# Patient Record
Sex: Male | Born: 1940 | Race: White | Hispanic: No | Marital: Married | Smoking: Current every day smoker
Health system: Southern US, Community
[De-identification: ages and names within clinical notes are randomized; demographics above are authoritative.]

## PROBLEM LIST (undated history)

## (undated) DIAGNOSIS — Z9289 Personal history of other medical treatment: Secondary | ICD-10-CM

## (undated) HISTORY — DX: Personal history of other medical treatment: Z92.89

---

## 1998-11-29 ENCOUNTER — Emergency Department (HOSPITAL_COMMUNITY): Admission: EM | Admit: 1998-11-29 | Discharge: 1998-11-29 | Payer: Self-pay | Admitting: Emergency Medicine

## 1999-08-21 ENCOUNTER — Encounter: Admission: RE | Admit: 1999-08-21 | Discharge: 1999-08-21 | Payer: Self-pay | Admitting: Family Medicine

## 1999-09-10 ENCOUNTER — Ambulatory Visit (HOSPITAL_COMMUNITY): Admission: RE | Admit: 1999-09-10 | Discharge: 1999-09-10 | Payer: Self-pay | Admitting: *Deleted

## 2001-11-11 DIAGNOSIS — Z9289 Personal history of other medical treatment: Secondary | ICD-10-CM

## 2001-11-11 HISTORY — DX: Personal history of other medical treatment: Z92.89

## 2002-01-20 ENCOUNTER — Encounter: Payer: Self-pay | Admitting: Family Medicine

## 2002-01-20 ENCOUNTER — Encounter: Admission: RE | Admit: 2002-01-20 | Discharge: 2002-01-20 | Payer: Self-pay | Admitting: Family Medicine

## 2002-09-24 ENCOUNTER — Encounter: Payer: Self-pay | Admitting: Family Medicine

## 2002-09-24 ENCOUNTER — Encounter: Admission: RE | Admit: 2002-09-24 | Discharge: 2002-09-24 | Payer: Self-pay | Admitting: Family Medicine

## 2002-09-27 ENCOUNTER — Encounter: Admission: RE | Admit: 2002-09-27 | Discharge: 2002-09-27 | Payer: Self-pay | Admitting: Family Medicine

## 2002-09-27 ENCOUNTER — Encounter: Payer: Self-pay | Admitting: Family Medicine

## 2002-11-11 DIAGNOSIS — Z9289 Personal history of other medical treatment: Secondary | ICD-10-CM

## 2002-11-11 HISTORY — DX: Personal history of other medical treatment: Z92.89

## 2002-11-19 ENCOUNTER — Ambulatory Visit (HOSPITAL_COMMUNITY): Admission: RE | Admit: 2002-11-19 | Discharge: 2002-11-19 | Payer: Self-pay | Admitting: *Deleted

## 2004-06-25 ENCOUNTER — Encounter: Admission: RE | Admit: 2004-06-25 | Discharge: 2004-07-05 | Payer: Self-pay | Admitting: Family Medicine

## 2004-07-23 ENCOUNTER — Encounter: Admission: RE | Admit: 2004-07-23 | Discharge: 2004-07-23 | Payer: Self-pay | Admitting: Gastroenterology

## 2004-09-28 ENCOUNTER — Ambulatory Visit (HOSPITAL_COMMUNITY): Admission: RE | Admit: 2004-09-28 | Discharge: 2004-09-28 | Payer: Self-pay | Admitting: Gastroenterology

## 2004-10-12 ENCOUNTER — Encounter: Admission: RE | Admit: 2004-10-12 | Discharge: 2004-10-12 | Payer: Self-pay | Admitting: Gastroenterology

## 2004-11-08 ENCOUNTER — Encounter: Admission: RE | Admit: 2004-11-08 | Discharge: 2005-02-06 | Payer: Self-pay | Admitting: Family Medicine

## 2005-05-28 ENCOUNTER — Encounter: Admission: RE | Admit: 2005-05-28 | Discharge: 2005-05-28 | Payer: Self-pay | Admitting: Family Medicine

## 2005-06-05 ENCOUNTER — Encounter: Admission: RE | Admit: 2005-06-05 | Discharge: 2005-06-05 | Payer: Self-pay | Admitting: Family Medicine

## 2005-06-07 ENCOUNTER — Encounter: Admission: RE | Admit: 2005-06-07 | Discharge: 2005-06-07 | Payer: Self-pay | Admitting: Family Medicine

## 2005-06-16 ENCOUNTER — Encounter: Admission: RE | Admit: 2005-06-16 | Discharge: 2005-06-16 | Payer: Self-pay | Admitting: Family Medicine

## 2007-04-15 ENCOUNTER — Encounter: Admission: RE | Admit: 2007-04-15 | Discharge: 2007-04-15 | Payer: Self-pay | Admitting: Family Medicine

## 2008-08-15 ENCOUNTER — Encounter: Admission: RE | Admit: 2008-08-15 | Discharge: 2008-08-15 | Payer: Self-pay | Admitting: Family Medicine

## 2008-12-06 ENCOUNTER — Encounter: Admission: RE | Admit: 2008-12-06 | Discharge: 2008-12-06 | Payer: Self-pay | Admitting: Family Medicine

## 2008-12-22 ENCOUNTER — Ambulatory Visit (HOSPITAL_COMMUNITY): Admission: RE | Admit: 2008-12-22 | Discharge: 2008-12-22 | Payer: Self-pay | Admitting: Urology

## 2009-12-04 ENCOUNTER — Encounter: Admission: RE | Admit: 2009-12-04 | Discharge: 2009-12-04 | Payer: Self-pay | Admitting: Family Medicine

## 2010-11-11 DIAGNOSIS — Z9289 Personal history of other medical treatment: Secondary | ICD-10-CM

## 2010-11-11 HISTORY — DX: Personal history of other medical treatment: Z92.89

## 2011-03-29 NOTE — Op Note (Signed)
   NAME:  Jay Moss, Jay Moss                        ACCOUNT NO.:  [id number]   MEDICAL RECORD NO.:  [id number]                   PATIENT TYPE:  OIB   LOCATION:  2855                                 FACILITY:  [facility]   PHYSICIAN:  Darlin Priestly, M.D.             DATE OF BIRTH:  [birth year]   DATE OF PROCEDURE:  11/19/2002  DATE OF DISCHARGE:                                 OPERATIVE REPORT   PROCEDURE:  Head-up tilt-table testing.   COMPLICATIONS:  None.   INDICATIONS:  The patient is a 70 year old male, patient of Dr. Talmadge Coventry, also seen by Dr. Oleh Genin for recurrent presyncope.  This has  happened both while sitting and standing.  The patient has undergone  extensive neurologic work-up, which was essentially negative. He has also  undergone 2-D echocardiogram revealing a mildly depressed EF of 45-50% with  mild anterior septal hypokinesis.  Yesterday, he had a Cardiolite scan  revealing very minimal inferoapical ischemia with a normal EF.  We did ask  him to undergo a tilt-table test to rule out neurogenic syncope.   DESCRIPTION OF PROCEDURE:  After given informed written consent, the patient  was brought to the cardiac catheterization lab in the fasting state.  The  patient was then placed in the supine position and hemodynamic measurements  were obtained.  Baseline blood pressure was 120/81 with a resting heart rate  of 111.  The patient was monitored for approximately 6 minutes and then was  tilted to a 70degree heads up position. The patient did well for  approximately 19 minutes; however, after 20 minutes, the patient began  complaining of feeling nauseated and hot. His blood pressure dropped to  57/44 with and heart rate dropped from 106 to 79.  The patient was again  returned to supine position.  He continued to complain of symptoms for  approximately 5-6 minutes and then had complete recovery of his blood  pressure and heart rate.  At the termination of the test,  the patient was  asymptomatic.   CONCLUSIONS:  Positive head-up tilt-table testing.                                                   Darlin Priestly, M.D.    RHM/MEDQ  D:  11/19/2002  T:  11/20/2002  Job:  [id number]   cc:   Talmadge Coventry, M.D.  [address], [address]  [place]  [place] [address]  Fax: [phone]   [place], Dr.

## 2011-03-29 NOTE — Op Note (Signed)
NAMEZEBULEN, Jay Moss              ACCOUNT NO.:  [id number]   MEDICAL RECORD NO.:  [id number]          PATIENT TYPE:  AMB   LOCATION:  ENDO                         FACILITY:  [facility]   PHYSICIAN:  Anselmo Rod, M.D.  DATE OF BIRTH:  [birth year]   DATE OF PROCEDURE:  09/28/2004  DATE OF DISCHARGE:                                 OPERATIVE REPORT   PROCEDURE:  Screening colonoscopy.   ENDOSCOPIST:  Charna Elizabeth, M.D.   INSTRUMENT USED:  Olympus video colonoscope.   INDICATIONS FOR PROCEDURE:  70 year old white male undergoing screening  colonoscopy to rule out colonic polyps, masses, etc.   PREPROCEDURE PREPARATION:  Informed consent was obtained from the patient.  The patient was fasted for eight hours prior to the procedure and prepped  with a bottle of magnesium citrate and a gallon of GoLYTELY the night prior  to the procedure.  All the risks and benefits of the procedure including the  10% miss rate of cancer and polyps was discussed with the patient.   PREPROCEDURE PHYSICAL:  Patient with stable vital signs.  Neck supple.  Chest clear to auscultation.  S1 and S2 regular.  Abdomen soft with normal  bowel sounds.   DESCRIPTION OF PROCEDURE:  The patient was placed in the left lateral  decubitus position, sedated with 75 mg of Demerol and 7.5 mg Versed in slow  incremental doses.  Once the patient was adequately sedated, maintained on  low flow oxygen and continuous cardiac monitoring, the Olympus video  colonoscope was advanced from the rectum to the cecum.  Sigmoid  diverticulosis was noted with small internal hemorrhoids seen on  retroflexion.  The terminal ileum appeared healthy and without lesions.  No  masses or polyps were seen.  The appendicular orifice and ileocecal valve  were clearly visualized and photographed.   IMPRESSION:  1.  Small internal hemorrhoids.  2.  Sigmoid diverticulosis.  3.  No masses or polyps seen.  4.  Normal appearing transverse colon, right  colon, cecum, and terminal      ileum.   RECOMMENDATIONS:  1.  Continue a high fiber diet with liberal fluid intake.  2.  Brochures on diverticulosis have been given to the patient for      education.  3.  Repeat colonoscopy is recommended in the next five years unless the      patient develops any abnormal symptoms in the interim.  4.  Outpatient follow up as the need arises in the future.       JNM/MEDQ  D:  09/28/2004  T:  09/28/2004  Job:  [id number]   cc:   Talmadge Coventry, M.D.  [address]  [place]  [place] [address]  Fax: [phone]

## 2014-11-24 ENCOUNTER — Telehealth: Payer: Self-pay | Admitting: Vascular Surgery

## 2014-11-24 ENCOUNTER — Telehealth: Payer: Self-pay | Admitting: *Deleted

## 2014-11-24 NOTE — Telephone Encounter (Signed)
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 Pt was a Dr. Jenne CampusMcqueen pt. Please call  Thanks

## 2014-11-24 NOTE — Telephone Encounter (Signed)
Patients wife called to state that his PCP ordered a AAA ultrasound and his aneurysm has increased in size. She stated that he saw a vascular surgeon in [place] in [place]. Epic does not indicate that he has ever been here. However, after further review, he was here in 2006 to see Dr Edilia Boickson and has a paper chart on site.  I have instructed the patients wife to have his PCP refer him back to our office, as it has been several years since his last visit. We will need to see the results of the ultrasound to determine the proper type of appointment needed. She agrees and will have the records faxed to our office.

## 2014-11-24 NOTE — Telephone Encounter (Signed)
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

## 2014-11-29 ENCOUNTER — Encounter: Payer: Self-pay | Admitting: Vascular Surgery

## 2014-11-30 ENCOUNTER — Ambulatory Visit (INDEPENDENT_AMBULATORY_CARE_PROVIDER_SITE_OTHER): Payer: Medicare Other | Admitting: Vascular Surgery

## 2014-11-30 ENCOUNTER — Encounter: Payer: Self-pay | Admitting: Vascular Surgery

## 2014-11-30 VITALS — BP 120/70 | HR 80 | Temp 97.7°F | Resp 16 | Ht 71.0 in | Wt 160.6 lb

## 2014-11-30 DIAGNOSIS — Z01810 Encounter for preprocedural cardiovascular examination: Secondary | ICD-10-CM

## 2014-11-30 DIAGNOSIS — I714 Abdominal aortic aneurysm, without rupture, unspecified: Secondary | ICD-10-CM | POA: Insufficient documentation

## 2014-11-30 NOTE — Progress Notes (Signed)
Patient ID: Jay Moss, male   DOB: 1940/12/31, 74 y.o.   MRN: [id number]  Reason for Consult: AAA   Referred by Threasa Beards, FNP  Subjective:     HPI:  Jay Moss is a 74 y.o. male who I last saw in 2006. I have been following a small abdominal aortic aneurysm. In April 2006 this measured 3.3 cm in maximum diameter and had not changed significantly in size. I was in the stretches follow up out to 1 year however I believe he moved at that point and he was then lost to follow up. The patient now lives in [place]. The aneurysm was being followed there. He had an abnormal chest x-ray with a right lung opacity noted and this prompted a CT scan. The CT scan showed that this was likely scar tissue. The scan extended into the upper abdominal aorta and his aneurysm could be identified. The radiologist interpreted this as 5.8 x 4.7 cm in diameter although there is significant tortuosity to the aneurysm and the 5.8 cm may be inaccurate. An ultrasound was obtained on 11/18/2014 which showed the maximum diameter of the aneurysm was 5.5 cm.  The patient denies any significant abdominal pain or back pain.  PAST MEDICAL HISTORY: The patient has a history of hyperlipidemia, hypertension, COPD, and type 2 diabetes.  FAMILY HISTORY: He denies any family history of aneurysmal disease. He denies any family history of premature cardiovascular disease.  SOCIAL HISTORY: The patient has a 1 pack per day history of smoking.  Short Social History:  History  Substance Use Topics  . Smoking status: Current Every Day Smoker -- 1.00 packs/day    Types: Cigarettes  . Smokeless tobacco: Not on file  . Alcohol Use: Not on file    Allergies  Allergen Reactions  . Penicillins     Current Outpatient Prescriptions  Medication Sig Dispense Refill  . diclofenac (VOLTAREN) 75 MG EC tablet Take 75 mg by mouth daily.    . metoprolol succinate (TOPROL-XL) 50 MG 24 hr tablet Take 50 mg by mouth daily.      . sertraline (ZOLOFT) 100 MG tablet Take 100 mg by mouth daily.     No current facility-administered medications for this visit.   Review of Systems  Constitutional: Negative for chills and fever.  Eyes: Negative for loss of vision.  Respiratory: Negative for cough and wheezing.  Cardiovascular: Positive for dyspnea with exertion. Negative for chest pain, chest tightness, claudication, orthopnea and palpitations.  GI: Negative for blood in stool and vomiting.  GU: Negative for dysuria and hematuria.  Musculoskeletal: Negative for leg pain, joint pain and myalgias.  Skin: Negative for rash and wound.  Neurological: Negative for dizziness and speech difficulty.  Hematologic: Negative for bruises/bleeds easily. Psychiatric: Negative for depressed mood.        Objective:  Objective  Filed Vitals:   11/30/14 1424  BP: 120/70  Pulse: 80  Temp: 97.7 F (36.5 C)  TempSrc: Oral  Resp: 16  Height:  (1.803 m)  Weight: 160 lb 9.6 oz (72.848 kg)  SpO2: 98%   Body mass index is 22.41 kg/(m^2).  Physical Exam  Constitutional: He is oriented to person, place, and time. He appears well-developed and well-nourished.  HENT:  Head: Normocephalic and atraumatic.  Neck: Neck supple. No JVD present. No thyromegaly present.  Cardiovascular: Normal rate, regular rhythm and normal heart sounds.  Exam reveals no friction rub.   No murmur heard. Pulses:  Femoral pulses are 2+ on the right side, and 2+ on the left side.      Posterior tibial pulses are 2+ on the right side, and 2+ on the left side.  I do not detect carotid bruits.  Pulmonary/Chest: Breath sounds normal. He has no wheezes. He has no rales.  Abdominal: Soft. Bowel sounds are normal. There is no tenderness.  His aneurysm is palpable and nontender.  Musculoskeletal: Normal range of motion. He exhibits no edema.  Lymphadenopathy:    He has no cervical adenopathy.  Neurological: He is alert and oriented to person,  place, and time. He has normal strength. No sensory deficit.  Skin: No lesion and no rash noted.  Psychiatric: He has a normal mood and affect.    Data: I have reviewed his ultrasound from the referring office. This was interpreted as showing 5.5 cm infrarenal abdominal aortic aneurysm.  I have also reviewed his CT scan images. The aorta somewhat tortuous and the 5.8 cm measurement may be inaccurate given the tortuosity of the aorta. He has significant calcific disease of the aortic neck. The CT scan does not extend into the pelvis and therefore I cannot evaluate the aortic bifurcation or iliac arteries.      Assessment/Plan:     ABDOMINAL AORTIC ANEURYSM: The patient has a gradually enlarging abdominal aortic aneurysm which is approaching 5.5 cm or may potentially be 5.5 cm or greater. It is difficult to determine from the current study. I have recommended a CT scan of the abdomen and pelvis with 2 mm cuts with and without IV contrast to further assess the aneurysm. If in fact the aneurysm is 5.5 cm or greater in diameter than he should be considered for elective repair. He would require preoperative cardiac workup. If the aneurysm is smaller than 5.5 cm that I would recommend a follow up study in 6 months. The CT scan will help determine if he is a candidate for endovascular repair if the aneurysm is large enough to consider repair. We have also discussed the importance of tobacco cessation. He understands a continued tobacco use does increase his risk of aneurysm expansion and rupture. Hopefully we can get his CT scan done tomorrow or Friday and make further recommendations so we can make plans as to whether or not to return home. I will make further recommendations pending these results.  I have explained to him that I will call him with those results. His number is [phone].     Chuck Hinthristopher S Dickson MD [facility]

## 2014-11-30 NOTE — Patient Instructions (Signed)
Please review the tobacco cessation information given to you today. It lists many hints that are useful in your effort to stop smoking. The [facility]ess[facility] contact phone # is [phone] These nurses and advisors offer lots of FREE information and aids to help you quit.    The [place] Quit Smoking line #  800-784-8669, they will also assist you with programs designed to help you stop smoking.     Abdominal Aortic Aneurysm An aneurysm is a weakened or damaged part of an artery wall that bulges from the normal force of blood pumping through the body. An abdominal aortic aneurysm is an aneurysm that occurs in the lower part of the aorta, the main artery of the body.  The major concern with an abdominal aortic aneurysm is that it can enlarge and burst (rupture) or blood can flow between the layers of the wall of the aorta through a tear (aorticdissection). Both of these conditions can cause bleeding inside the body and can be life threatening unless diagnosed and treated promptly. CAUSES  The exact cause of an abdominal aortic aneurysm is unknown. Some contributing factors are:   A hardening of the arteries caused by the buildup of fat and other substances in the lining of a blood vessel (arteriosclerosis).  Inflammation of the walls of an artery (arteritis).   Connective tissue diseases, such as Marfan syndrome.   Abdominal trauma.   An infection, such as syphilis or staphylococcus, in the wall of the aorta (infectious aortitis) caused by bacteria. RISK FACTORS  Risk factors that contribute to an abdominal aortic aneurysm may include:  Age older than 60 years.   High blood pressure (hypertension).  Male gender.  Ethnicity (white race).  Obesity.  Family history of aneurysm (first degree relatives only).  Tobacco use. PREVENTION  The following healthy lifestyle habits may help decrease your risk of abdominal aortic aneurysm:  Quitting smoking. Smoking can raise your blood  pressure and cause arteriosclerosis.  Limiting or avoiding alcohol.  Keeping your blood pressure, blood sugar level, and cholesterol levels within normal limits.  Decreasing your salt intake. In somepeople, too much salt can raise blood pressure and increase your risk of abdominal aortic aneurysm.  Eating a diet low in saturated fats and cholesterol.  Increasing your fiber intake by including whole grains, vegetables, and fruits in your diet. Eating these foods may help lower blood pressure.  Maintaining a healthy weight.  Staying physically active and exercising regularly. SYMPTOMS  The symptoms of abdominal aortic aneurysm may vary depending on the size and rate of growth of the aneurysm.Most grow slowly and do not have any symptoms. When symptoms do occur, they may include:  Pain (abdomen, side, lower back, or groin). The pain may vary in intensity. A sudden onset of severe pain may indicate that the aneurysm has ruptured.  Feeling full after eating only small amounts of food.  Nausea or vomiting or both.  Feeling a pulsating lump in the abdomen.  Feeling faint or passing out. DIAGNOSIS  Since most unruptured abdominal aortic aneurysms have no symptoms, they are often discovered during diagnostic exams for other conditions. An aneurysm may be found during the following procedures:  Ultrasonography (A one-time screening for abdominal aortic aneurysm by ultrasonography is also recommended for all men aged 65-75 years who have ever smoked).  X-ray exams.  A computed tomography (CT).  Magnetic resonance imaging (MRI).  Angiography or arteriography. TREATMENT  Treatment of an abdominal aortic aneurysm depends on the size of your   aneurysm, your age, and risk factors for rupture. Medication to control blood pressure and pain may be used to manage aneurysms smaller than 6 cm. Regular monitoring for enlargement may be recommended by your caregiver if:  The aneurysm is 3-4 cm in  size (an annual ultrasonography may be recommended).  The aneurysm is 4-4.5 cm in size (an ultrasonography every 6 months may be recommended).  The aneurysm is larger than 4.5 cm in size (your caregiver may ask that you be examined by a vascular surgeon). If your aneurysm is larger than 6 cm, surgical repair may be recommended. There are two main methods for repair of an aneurysm:   Endovascular repair (a minimally invasive surgery). This is done most often.  Open repair. This method is used if an endovascular repair is not possible. Document Released: 08/07/2005 Document Revised: 02/22/2013 Document Reviewed: 11/27/2012 Exit[employer] Patient Information 2015 [employer]. This information is not intended to replace advice given to you by your health care provider. Make sure you discuss any questions you have with your health care provider.  

## 2014-12-01 ENCOUNTER — Ambulatory Visit
Admission: RE | Admit: 2014-12-01 | Discharge: 2014-12-01 | Disposition: A | Discharge status: Home / Self Care | Payer: Medicare Other | Source: Ambulatory Visit | Attending: Vascular Surgery | Admitting: Vascular Surgery

## 2014-12-01 DIAGNOSIS — I714 Abdominal aortic aneurysm, without rupture, unspecified: Secondary | ICD-10-CM

## 2014-12-01 DIAGNOSIS — Z01810 Encounter for preprocedural cardiovascular examination: Secondary | ICD-10-CM

## 2014-12-01 MED ORDER — IOHEXOL 350 MG/ML SOLN
75.0000 mL | Freq: Once | INTRAVENOUS | Status: AC | PRN
  Administered 2014-12-01: 75 mL via INTRAVENOUS

## 2014-12-05 ENCOUNTER — Ambulatory Visit: Payer: Medicare Other | Admitting: Interventional Cardiology

## 2015-02-04 IMAGING — CT CT CTA ABD/PEL W/CM AND/OR W/O CM
1 of 7 series · 12 of 46 positions shown, 17 images · IV contrast (omnipaque)
Comparison: COMPARISON
12/12/2008

CLINICAL DATA: Abdominal aortic aneurysm, preop. Recent ultrasound
performed at outside institution suggested enlargement. Previous
tobacco abuse.

EXAM:
CT ANGIOGRAPHY ABDOMEN AND PELVIS
TECHNIQUE: Multidetector CT imaging of the abdomen and pelvis was performed
using the standard protocol during bolus administration of
intravenous contrast. Multiplanar reconstructed images including
MIPs were obtained and reviewed to evaluate the vascular anatomy.
CONTRAST:  75mL OMNIPAQUE IOHEXOL 350 MG/ML SOLN

[Series 4: angio (id) · axial · 0.73mm/px · z∈[-431,-41]mm · 12 of 180 slices shown, 17 images]
[im 12/180  soft-tissue]
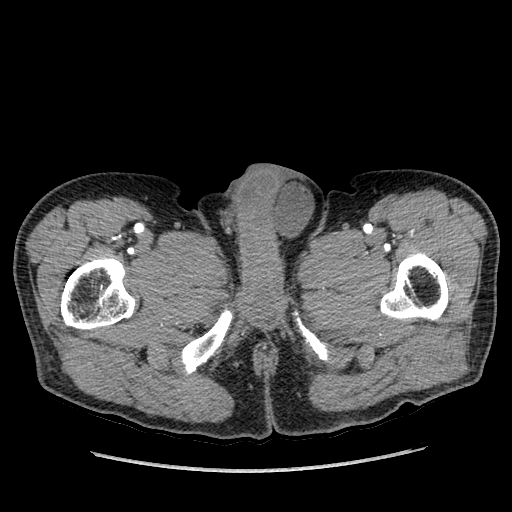
[im 12/180  bone]
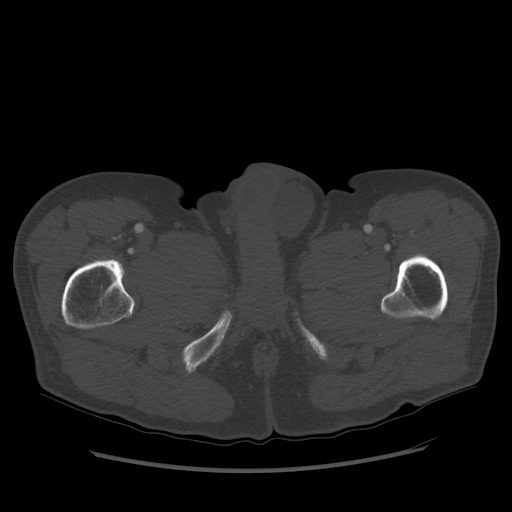
[im 24/180  soft-tissue]
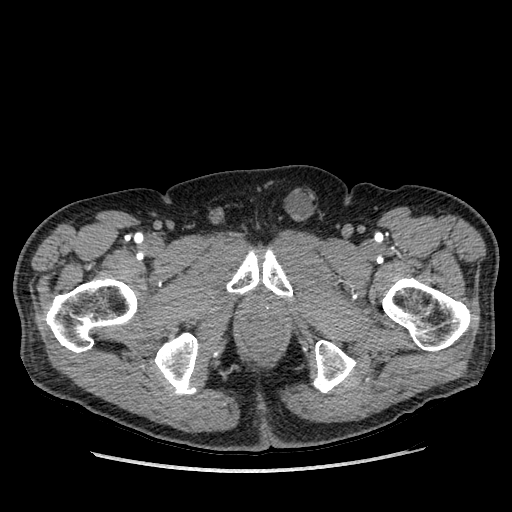
[im 48/180  soft-tissue]
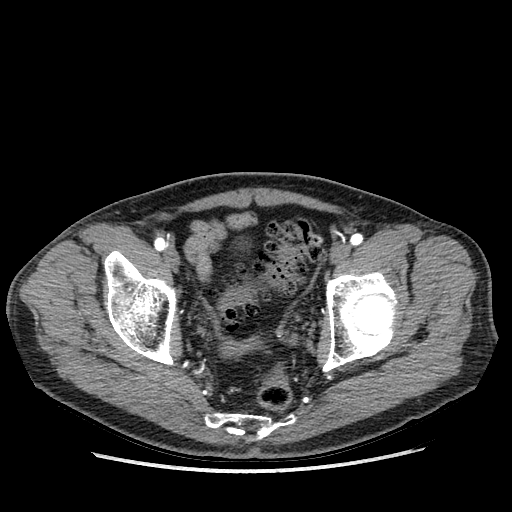
[im 60/180  soft-tissue]
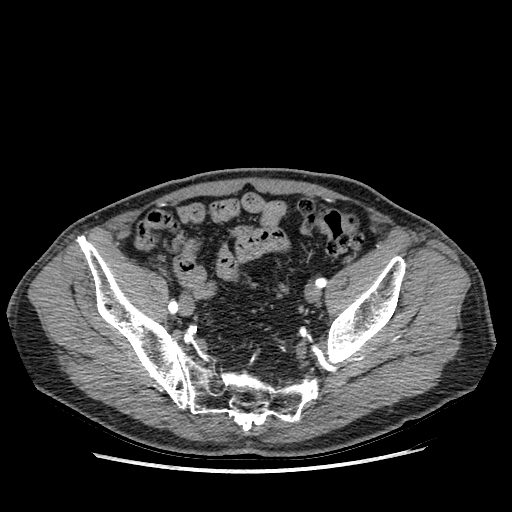
[im 72/180  soft-tissue]
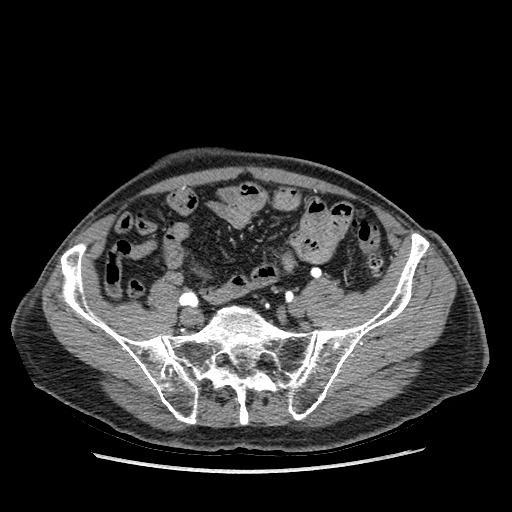
[im 96/180  soft-tissue]
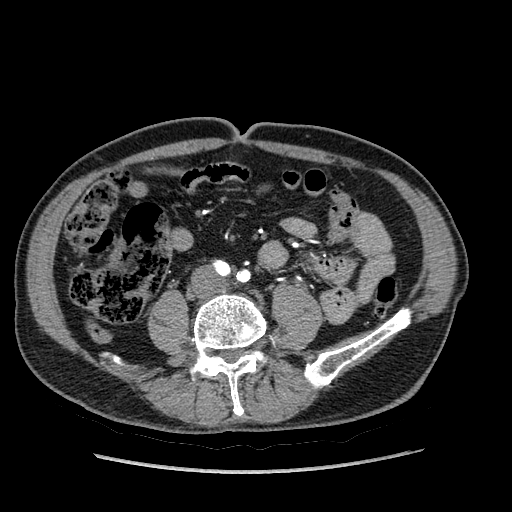
[im 108/180  soft-tissue]
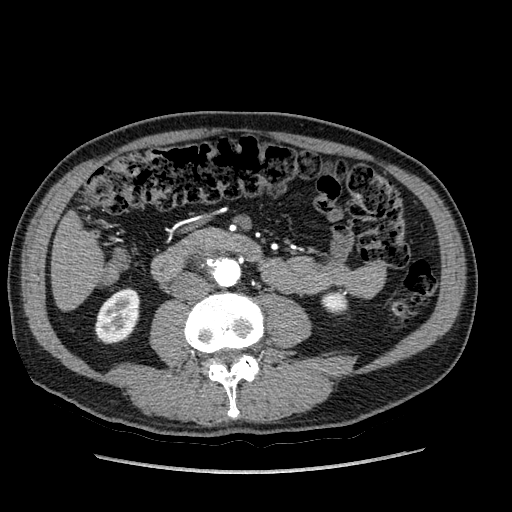
[im 120/180  soft-tissue]
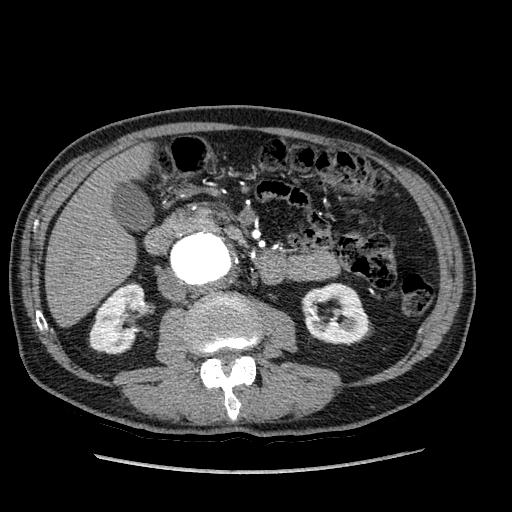
[im 132/180  soft-tissue]
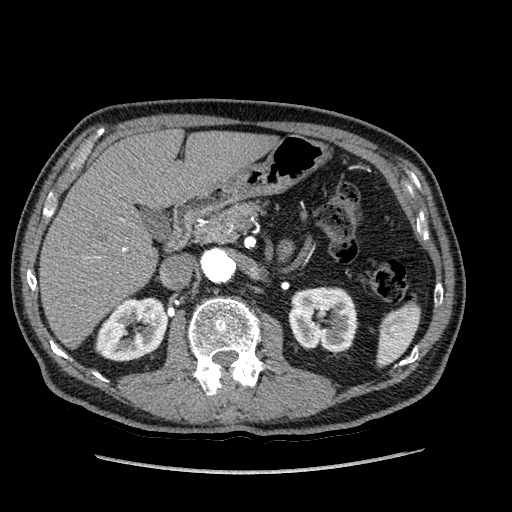
[im 132/180  lung]
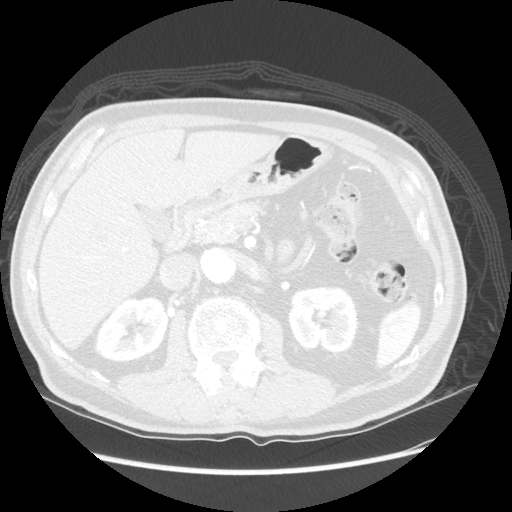
[im 132/180  bone]
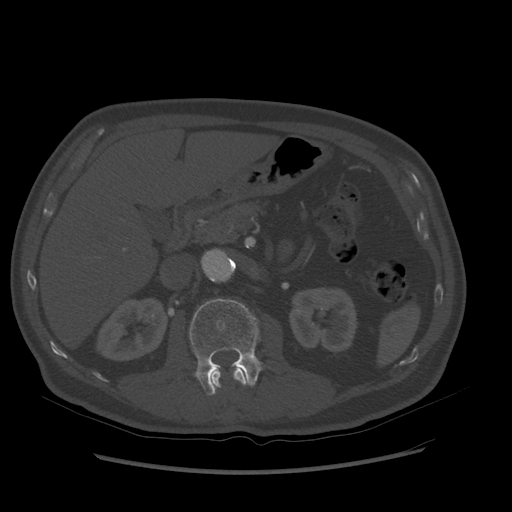
[im 144/180  lung]
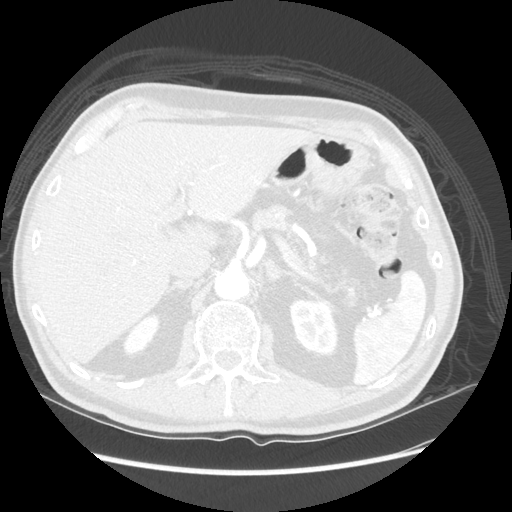
[im 156/180  soft-tissue]
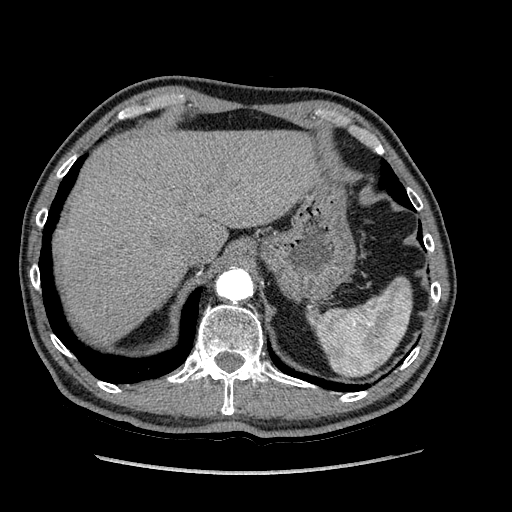
[im 156/180  lung]
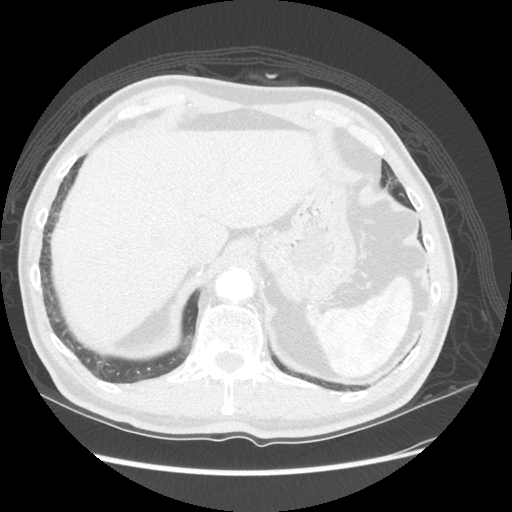
[im 168/180  soft-tissue]
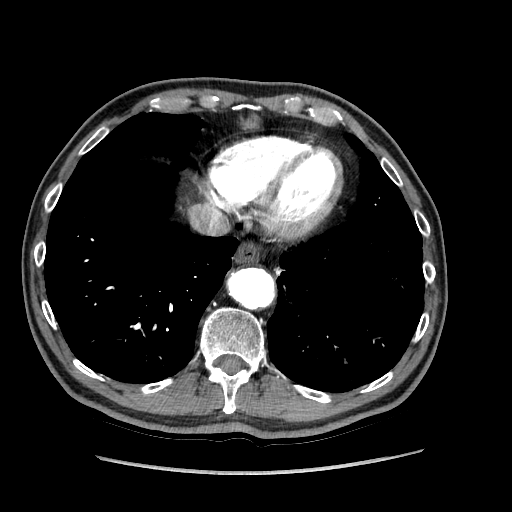
[im 168/180  lung]
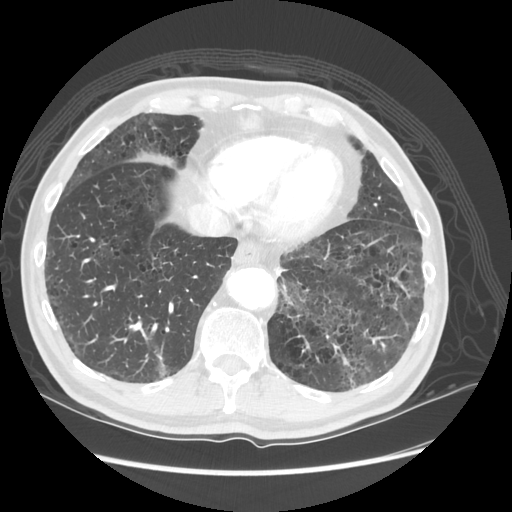

[12 of 46 positions shown; findings below may reference images not displayed]

FINDINGS: ARTERIAL FINDINGS:

Aorta: Eccentric plaque and nonocclusive mural thrombus in the
visualized ectatic distal descending thoracic segment, which
measures up to 3.5 cm diameter. The suprarenal aorta also has mild
nonocclusive plaque, measuring 3.1 cm diameter. Juxtarenal segment
mildly tortuous, 2.1 cm diameter. There is a fusiform aneurysm of
the infrarenal segment measuring 6 x 4.8 cm maximum transverse
dimensions (image 66/4, previously 4.5 x 3.4 cm), tapering to a
diameter of 2.1 cm above the diaphragm bifurcation. No dissection or
stenosis.

Celiac axis: The splenic artery and common hepatic artery arise
separately from the aorta, both demonstrating proximal narrowing
related to the median arcuate ligament of the diaphragm.

Superior mesenteric: Patent, with eccentric calcified ostial plaque
extending over a length of at least the 13 mm without high-grade
stenosis, classic distal branch anatomy.

Left renal: Single, with short-segment ostial stenosis less than 1
cm of at least moderate severity, patent distally.

Right renal: Single, with partially calcified short-segment ostial
plaque less than 1 cm resulting in at least mild stenosis, patent
distally.

Inferior mesenteric: Patent, arising from the aneurysmal segment of
the aorta.

Left iliac: Nondilated. Nonocclusive plaque in the common iliac.
Mild tortuosity.

Right iliac: Nondilated. Mild tortuosity. Scattered nonocclusive
plaque in the common iliac artery.

Venous findings: Dedicated venous phase imaging not obtained. Patent
portal vein, superior mesenteric vein, splenic vein, and bilateral
renal veins are noted.

Review of the MIP images confirms the above findings.

Nonvascular findings: Negative for retroperitoneal hemorrhage or
periaortic inflammatory change. Unremarkable liver, nondilated
gallbladder, spleen, adrenal glands, kidneys, pancreas. No
hydronephrosis. Stomach, small bowel, and colon are nondilated.
Innumerable distal descending and sigmoid diverticula without
significant adjacent inflammatory/edematous change. Urinary bladder
incompletely distended. Moderate prostatic enlargement with central
coarse calcifications. No ascites. No free air. No adenopathy. Mild
degenerative disc disease L4-5 with a degree of spinal stenosis.
IMPRESSION: 1. 6 cm infrarenal abdominal aortic aneurysm, increased from 4.5 cm
on 12/12/2008, without complicating features.
2. Tortuosity of the iliac arterial systems without aneurysm or
stenosis.
3. Bilateral renal artery ostial stenoses, left greater than right,
of possible hemodynamic significance.
4. Descending and sigmoid diverticulosis.

## 2015-06-20 ENCOUNTER — Encounter: Payer: Self-pay | Admitting: *Deleted

## 2015-08-07 ENCOUNTER — Encounter: Payer: Self-pay | Admitting: Cardiology

## 2015-08-07 ENCOUNTER — Encounter: Payer: Self-pay | Admitting: Cardiovascular Disease

## 2021-12-12 DEATH — deceased
